# Patient Record
Sex: Female | Born: 1995 | Hispanic: No | Marital: Single | Smoking: Never smoker
Health system: Southern US, Community
[De-identification: ages and names within clinical notes are randomized; demographics above are authoritative.]

## PROBLEM LIST (undated history)

## (undated) DIAGNOSIS — J45909 Unspecified asthma, uncomplicated: Secondary | ICD-10-CM

## (undated) HISTORY — PX: APPENDECTOMY: SHX54

## (undated) HISTORY — DX: Unspecified asthma, uncomplicated: J45.909

---

## 2019-11-23 ENCOUNTER — Other Ambulatory Visit: Payer: Self-pay

## 2019-11-23 DIAGNOSIS — Z20822 Contact with and (suspected) exposure to covid-19: Secondary | ICD-10-CM

## 2019-11-25 LAB — NOVEL CORONAVIRUS, NAA: SARS-CoV-2, NAA: NOT DETECTED

## 2021-05-28 ENCOUNTER — Other Ambulatory Visit: Payer: Self-pay

## 2021-05-28 ENCOUNTER — Emergency Department (HOSPITAL_COMMUNITY): Payer: 59

## 2021-05-28 ENCOUNTER — Emergency Department (HOSPITAL_BASED_OUTPATIENT_CLINIC_OR_DEPARTMENT_OTHER)
Admission: EM | Admit: 2021-05-28 | Discharge: 2021-05-29 | Disposition: A | Discharge status: Home / Self Care | Payer: 59 | Attending: Emergency Medicine | Admitting: Emergency Medicine

## 2021-05-28 ENCOUNTER — Encounter (HOSPITAL_BASED_OUTPATIENT_CLINIC_OR_DEPARTMENT_OTHER): Payer: Self-pay

## 2021-05-28 DIAGNOSIS — R531 Weakness: Secondary | ICD-10-CM | POA: Insufficient documentation

## 2021-05-28 DIAGNOSIS — R29898 Other symptoms and signs involving the musculoskeletal system: Secondary | ICD-10-CM

## 2021-05-28 DIAGNOSIS — R202 Paresthesia of skin: Secondary | ICD-10-CM | POA: Insufficient documentation

## 2021-05-28 DIAGNOSIS — J45909 Unspecified asthma, uncomplicated: Secondary | ICD-10-CM | POA: Diagnosis not present

## 2021-05-28 DIAGNOSIS — R519 Headache, unspecified: Secondary | ICD-10-CM | POA: Diagnosis not present

## 2021-05-28 DIAGNOSIS — R200 Anesthesia of skin: Secondary | ICD-10-CM

## 2021-05-28 LAB — BASIC METABOLIC PANEL
Anion gap: 9 (ref 5–15)
BUN: 11 mg/dL (ref 6–20)
CO2: 24 mmol/L (ref 22–32)
Calcium: 9.6 mg/dL (ref 8.9–10.3)
Chloride: 104 mmol/L (ref 98–111)
Creatinine, Ser: 0.67 mg/dL (ref 0.44–1.00)
GFR, Estimated: >60 mL/min (ref >60)
Glucose, Bld: 137 mg/dL — ABNORMAL HIGH (ref 70–99)
Potassium: 3.8 mmol/L (ref 3.5–5.1)
Sodium: 137 mmol/L (ref 135–145)

## 2021-05-28 LAB — CBC WITH DIFFERENTIAL/PLATELET
Abs Immature Granulocytes: 0.02 10*3/uL (ref 0.00–0.07)
Basophils Absolute: 0 10*3/uL (ref 0.0–0.1)
Basophils Relative: 1 %
Eosinophils Absolute: 0.3 10*3/uL (ref 0.0–0.5)
Eosinophils Relative: 3 %
HCT: 40.6 % (ref 36.0–46.0)
Hemoglobin: 13.3 g/dL (ref 12.0–15.0)
Immature Granulocytes: 0 %
Lymphocytes Relative: 31 %
Lymphs Abs: 2.6 10*3/uL (ref 0.7–4.0)
MCH: 28.9 pg (ref 26.0–34.0)
MCHC: 32.8 g/dL (ref 30.0–36.0)
MCV: 88.1 fL (ref 80.0–100.0)
Monocytes Absolute: 0.5 10*3/uL (ref 0.1–1.0)
Monocytes Relative: 5 %
Neutro Abs: 5.1 10*3/uL (ref 1.7–7.7)
Neutrophils Relative %: 60 %
Platelets: 222 10*3/uL (ref 150–400)
RBC: 4.61 MIL/uL (ref 3.87–5.11)
RDW: 12.8 % (ref 11.5–15.5)
WBC: 8.5 10*3/uL (ref 4.0–10.5)
nRBC: 0 % (ref 0.0–0.2)

## 2021-05-28 LAB — MAGNESIUM: Magnesium: 1.9 mg/dL (ref 1.7–2.4)

## 2021-05-28 IMAGING — MR MR HEAD WO/W CM
13 of 16 series · 28 of 48 positions shown · IV contrast (gadavist)
Comparison: None.

CLINICAL DATA: Bilateral lower extremity weakness.

EXAM:
MRI HEAD WITHOUT AND WITH CONTRAST
TECHNIQUE: Multiplanar, multiecho pulse sequences of the brain and surrounding
structures were obtained without and with intravenous contrast.
CONTRAST:  6.5mL GADAVIST GADOBUTROL 1 MMOL/ML IV SOLN

[Series 5: T1 · sagittal · 5.0mm · 0.75mm/px · 1 of 31 slices shown]
[im 1/31]
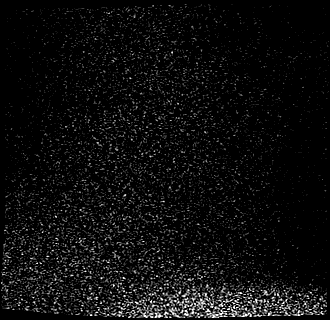

[Series 6: DWI · axial · 3.0mm · 0.88mm/px · z∈[-139,+6]mm · 4 of 102 slices shown (1 of 2)]
[im 1/102]
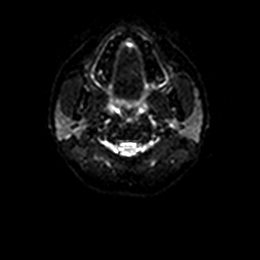
[im 34/102]
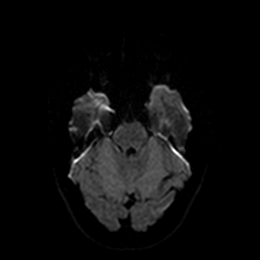
[im 68/102]
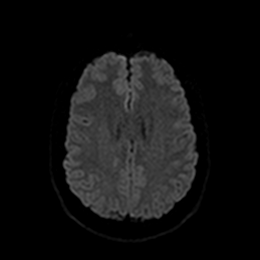
[im 102/102]
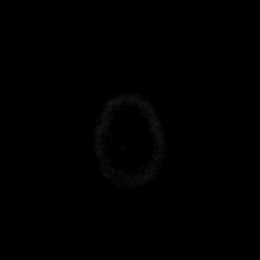

[Series 7: DWI · axial · 3.0mm · 0.88mm/px · z∈[-139,+6]mm · 2 of 51 slices shown (2 of 2)]
[im 1/51]
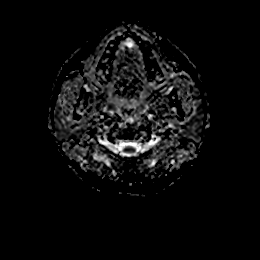
[im 51/51]
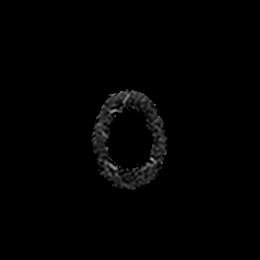

[Series 8: T2 · axial · 4.0mm · 0.72mm/px · 1 of 32 slices shown (1 of 2)]
[im 1/32]
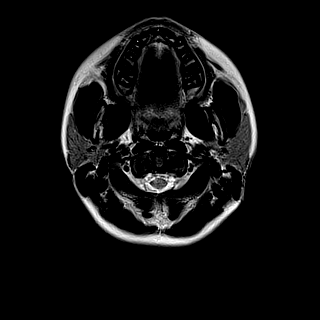

[Series 9: FLAIR · axial · 3.0mm · 0.45mm/px · z∈[-136,+8]mm · 2 of 44 slices shown]
[im 1/44]
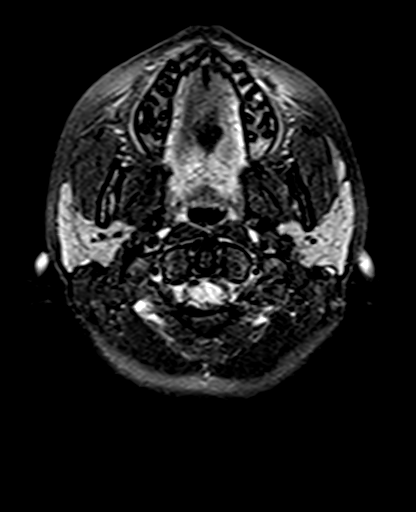
[im 44/44]
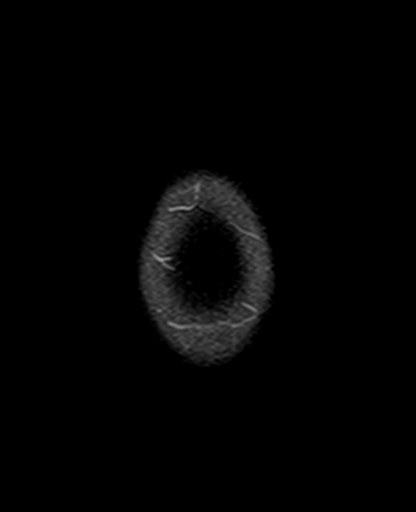

[Series 11: pha_images · axial · 3.0mm · 0.90mm/px · z∈[-143,+25]mm · 2 of 55 slices shown]
[im 1/55]
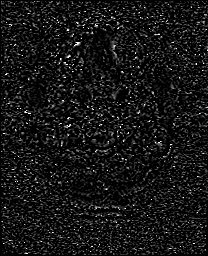
[im 55/55]
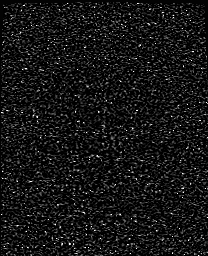

[Series 12: swi_images · axial · 3.0mm · 0.90mm/px · z∈[-143,+28]mm · 2 of 60 slices shown]
[im 1/60]
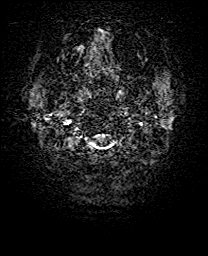
[im 60/60]
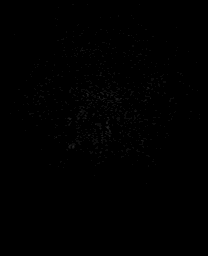

[Series 14: t2_space_dark-fluid_sag_p2_ns-ir · sagittal · 1.0mm · 0.49mm/px · 7 of 192 slices shown]
[im 1/192]
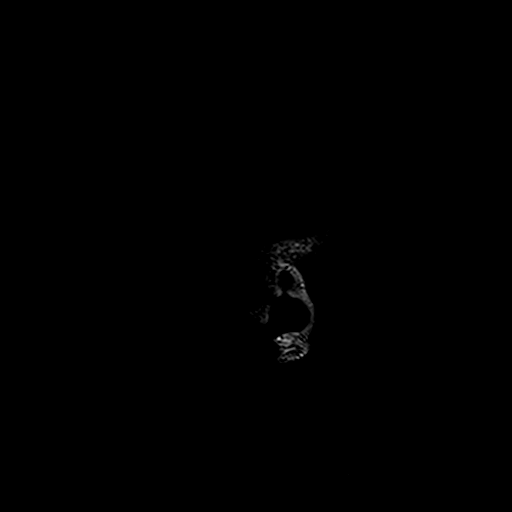
[im 32/192]
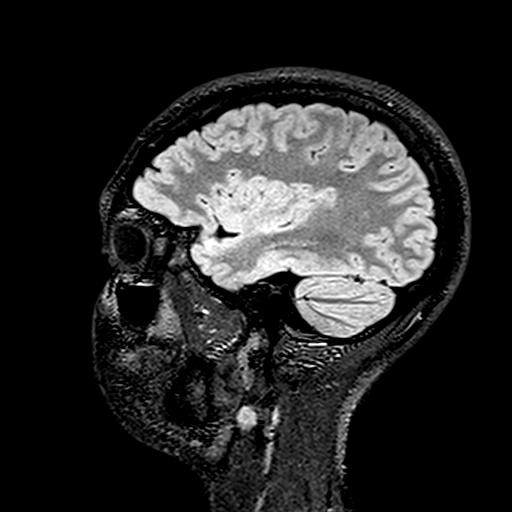
[im 64/192]
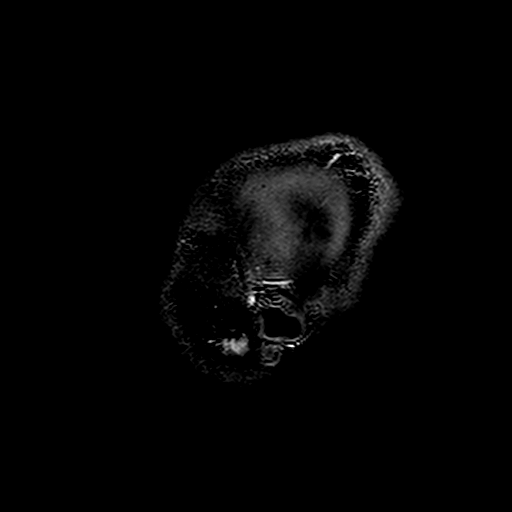
[im 96/192]
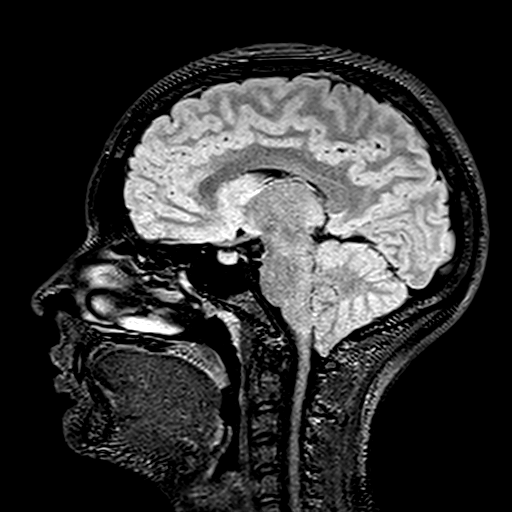
[im 128/192]
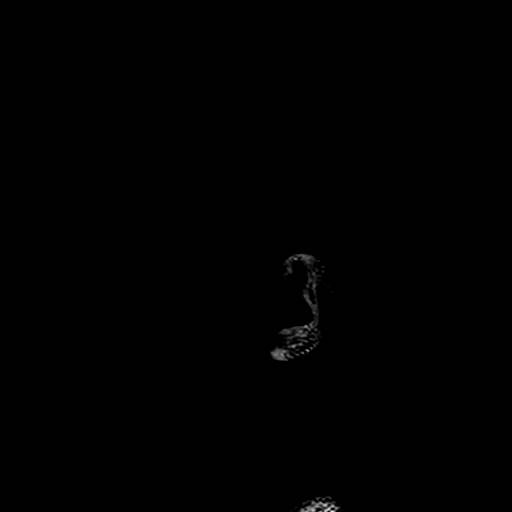
[im 160/192]
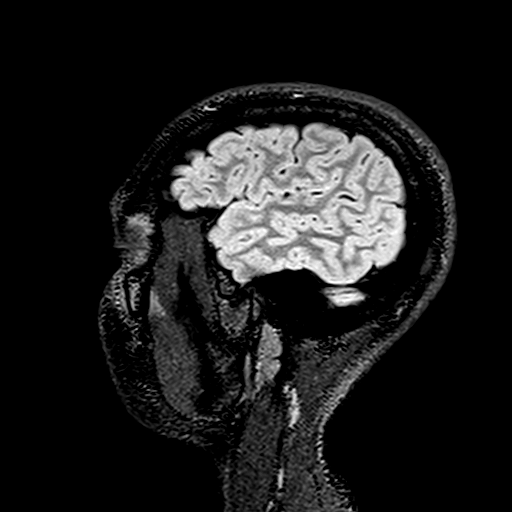
[im 192/192]
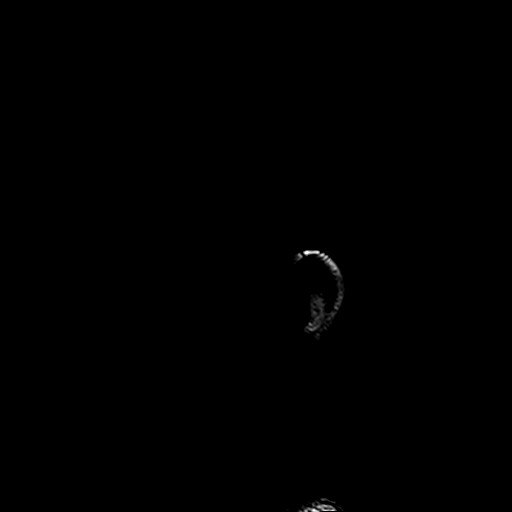

[Series 15: t2_space_dark-fluid_sag_p2_ns-ir_mpr_ axial · axial · 1.0mm · 0.45mm/px · z∈[-126,-58]mm · 3 of 142 slices shown]
[im 1/142]
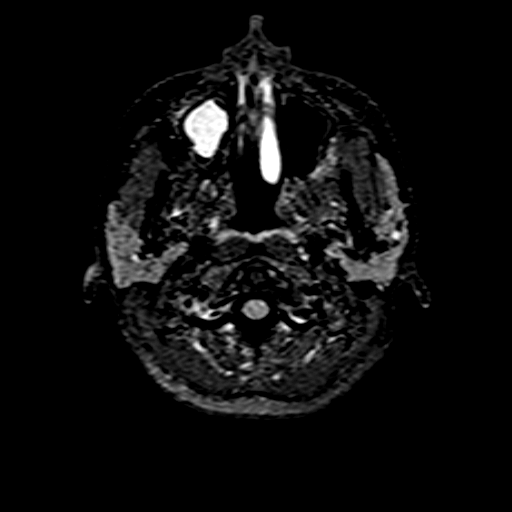
[im 36/142]
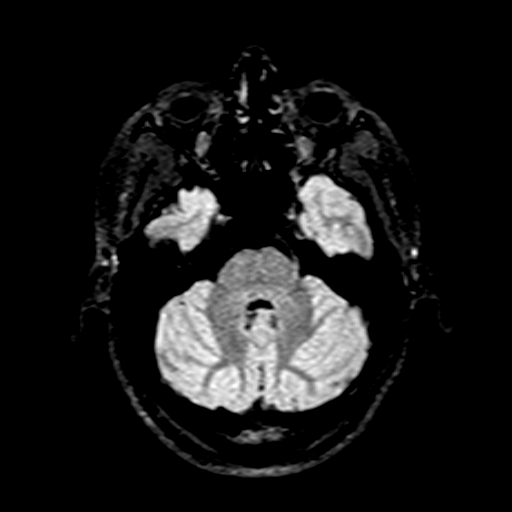
[im 71/142]
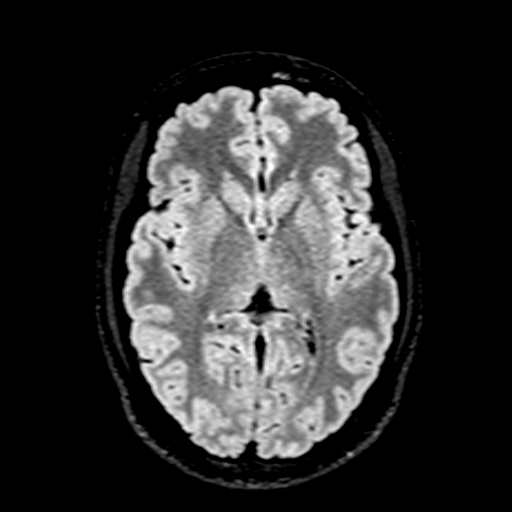

[Series 18: T2 · coronal · 5.0mm · 0.34mm/px · 1 of 29 slices shown (2 of 2)]
[im 1/29]
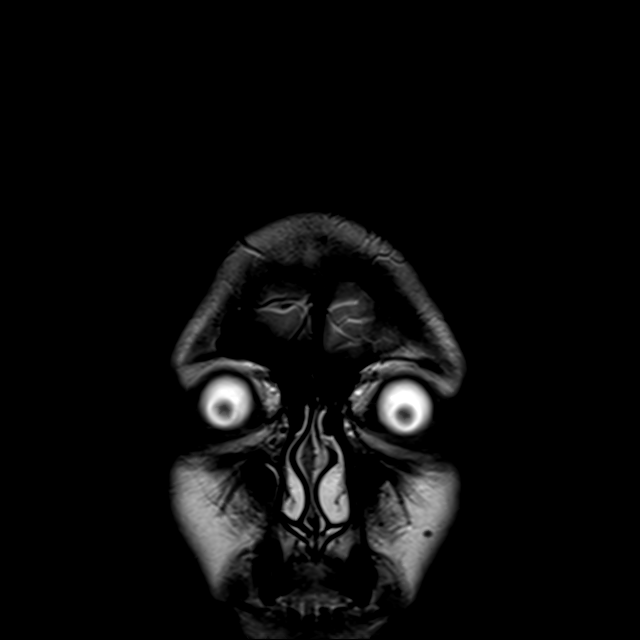

[Series 19: T2 post-contrast · coronal · 5.0mm · 0.72mm/px · 1 of 28 slices shown]
[im 1/28]
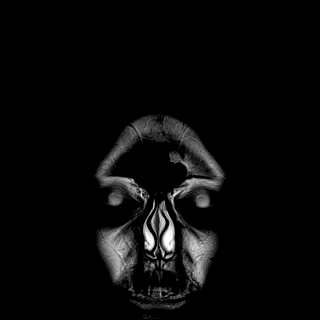

[Series 21: T1 post-contrast · coronal · 5.0mm · 0.34mm/px · 1 of 29 slices shown (1 of 2)]
[im 1/29]
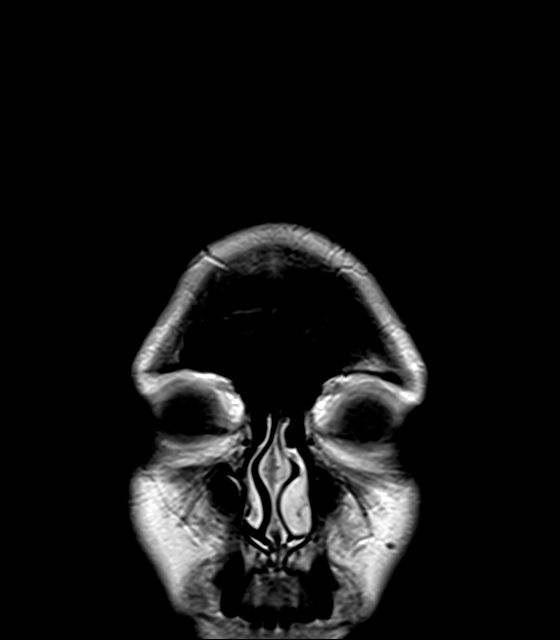

[Series 22: T1 post-contrast · sagittal · 4.0mm · 0.75mm/px · 1 of 31 slices shown (2 of 2)]
[im 1/31]
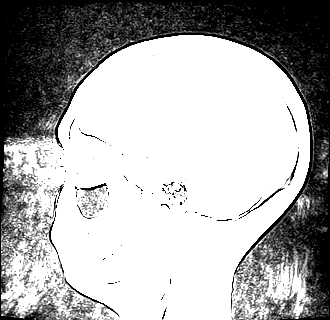

[28 of 48 positions shown; findings below may reference images not displayed]

FINDINGS: Brain: No acute infarct, acute hemorrhage or extra-axial collection.
Normal white matter signal. Normal volume of CSF spaces. No chronic
microhemorrhage. Normal midline structures. There is no abnormal
contrast enhancement.

Vascular: Major flow voids are preserved.

Skull and upper cervical spine: Normal calvarium and skull base.
Visualized upper cervical spine and soft tissues are normal.

Sinuses/Orbits:No paranasal sinus fluid levels or advanced mucosal
thickening. No mastoid or middle ear effusion. Normal orbits.
IMPRESSION: Normal brain MRI.

## 2021-05-28 IMAGING — MR MR CERVICAL SPINE WO/W CM
7 of 8 series · 31 of 48 positions shown · IV contrast (Gadavist)
Comparison: None.

CLINICAL DATA: Bilateral lower extremity weakness

EXAM:
MRI CERVICAL SPINE WITHOUT AND WITH CONTRAST
TECHNIQUE: Multiplanar and multiecho pulse sequences of the cervical spine, to
include the craniocervical junction and cervicothoracic junction,
were obtained without and with intravenous contrast.
CONTRAST:  6.5mL GADAVIST GADOBUTROL 1 MMOL/ML IV SOLN

[Series 5: T2 · sagittal · 3.0mm · 0.69mm/px · 3 of 15 slices shown (1 of 2)]
[im 1/15]
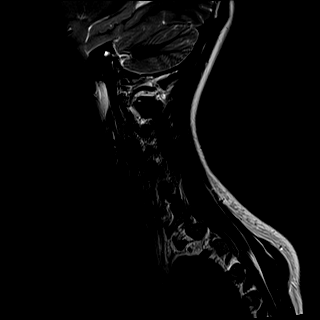
[im 8/15]
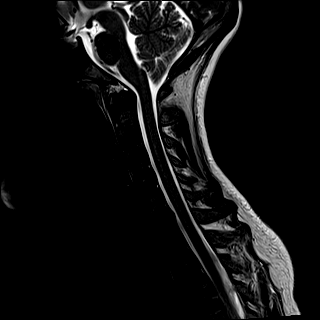
[im 15/15]
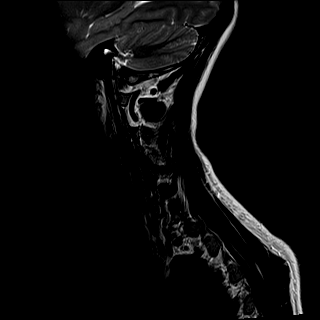

[Series 6: T1 · sagittal · 3.0mm · 0.69mm/px · 3 of 15 slices shown (1 of 2)]
[im 1/15]
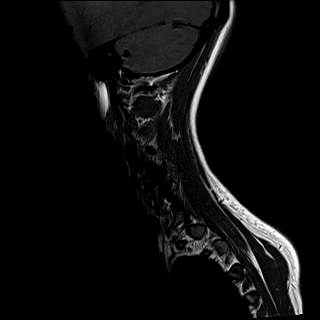
[im 8/15]
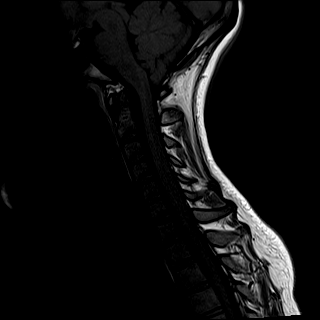
[im 15/15]
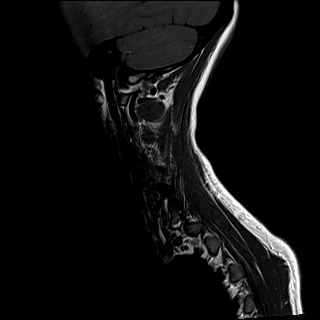

[Series 7: STIR · sagittal · 3.0mm · 0.86mm/px · 3 of 15 slices shown]
[im 1/15]
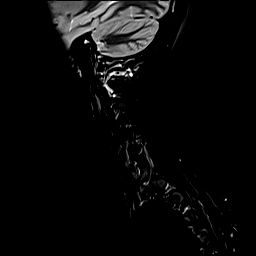
[im 8/15]
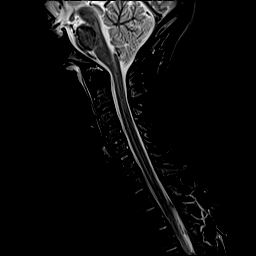
[im 15/15]
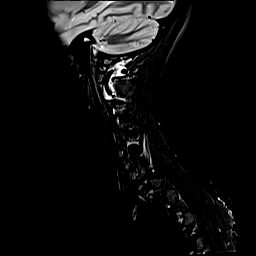

[Series 8: T2 · axial · 3.0mm · 0.66mm/px · z∈[-251,-131]mm · 9 of 40 slices shown (2 of 2)]
[im 1/40]
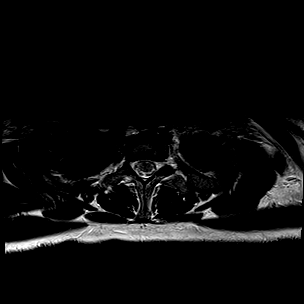
[im 5/40]
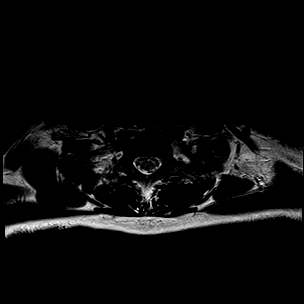
[im 10/40]
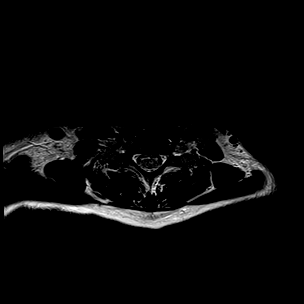
[im 15/40]
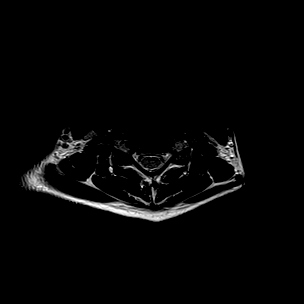
[im 20/40]
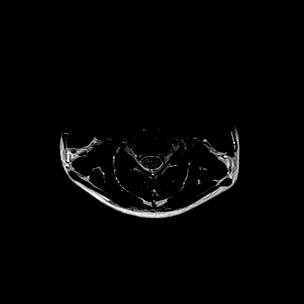
[im 25/40]
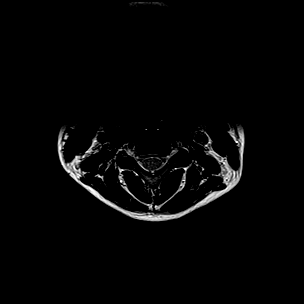
[im 30/40]
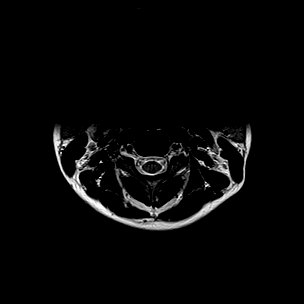
[im 35/40]
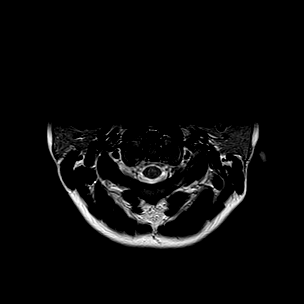
[im 40/40]
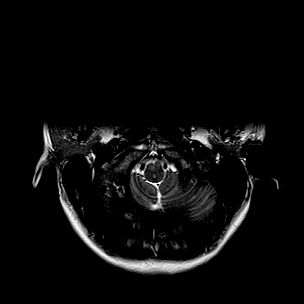

[Series 10: T1 · axial · 3.0mm · 0.39mm/px · z∈[-251,-131]mm · 9 of 40 slices shown (2 of 2)]
[im 1/40]
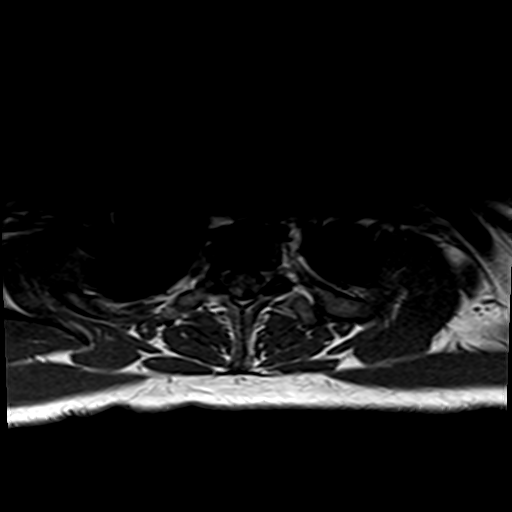
[im 5/40]
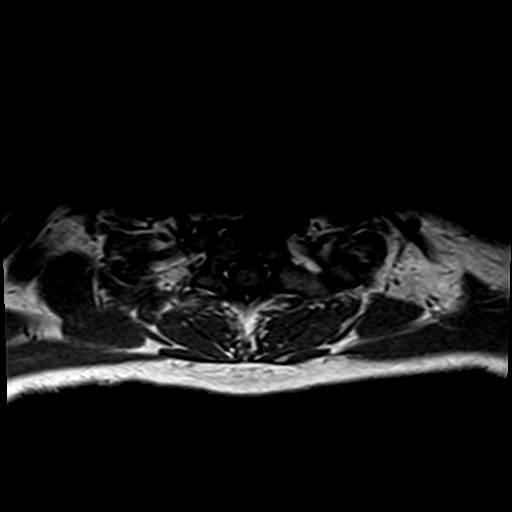
[im 10/40]
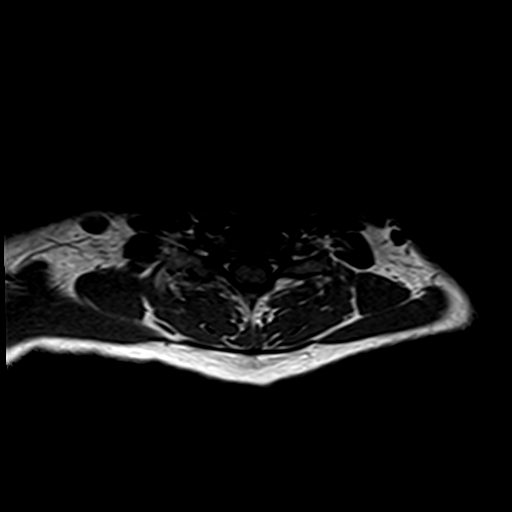
[im 15/40]
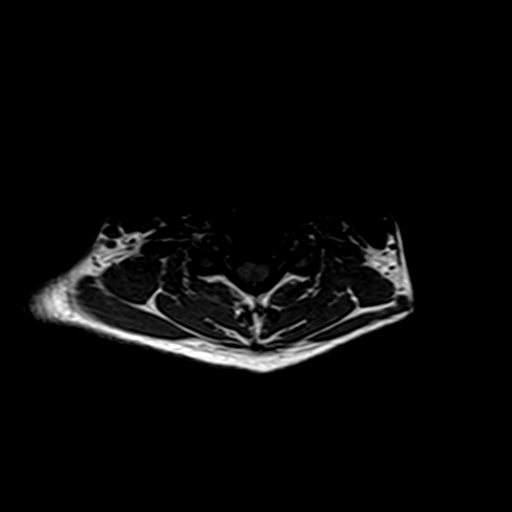
[im 20/40]
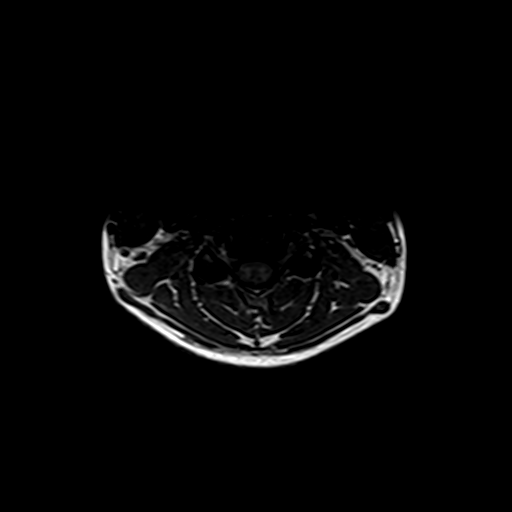
[im 25/40]
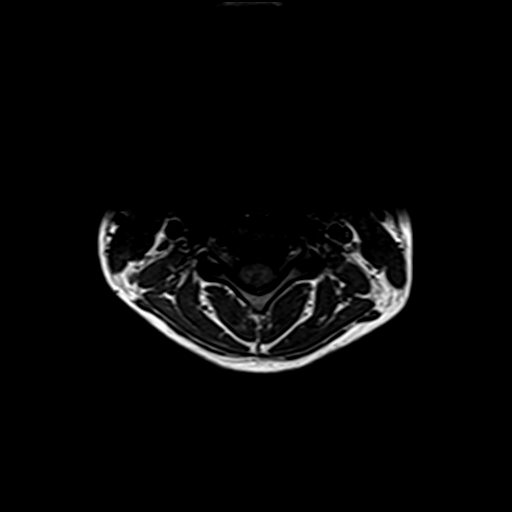
[im 30/40]
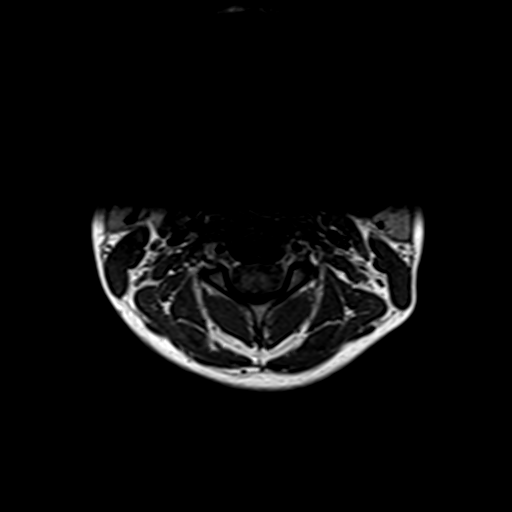
[im 35/40]
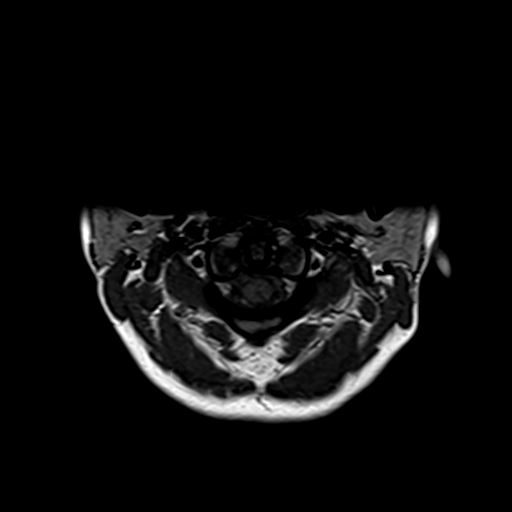
[im 40/40]
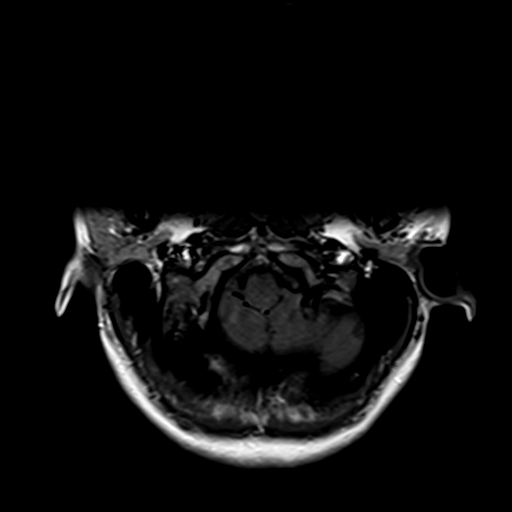

[Series 11: T1 fat-sat post-contrast · sagittal · 3.0mm · 0.43mm/px · 3 of 15 slices shown]
[im 1/15]
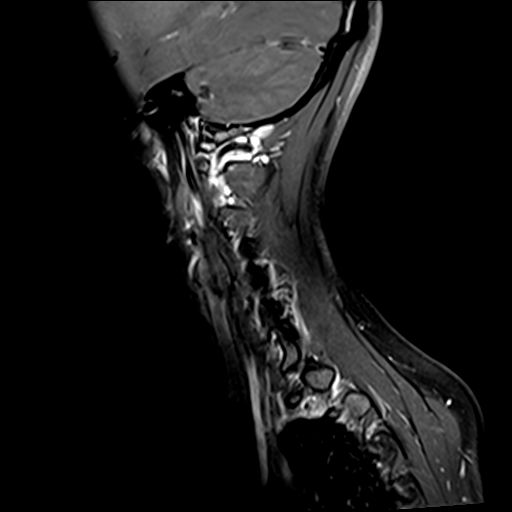
[im 8/15]
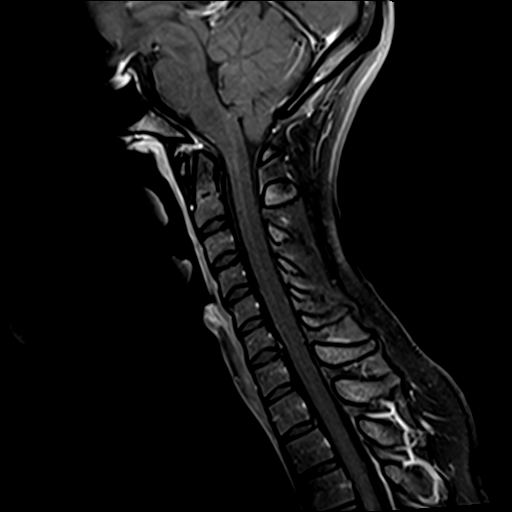
[im 15/15]
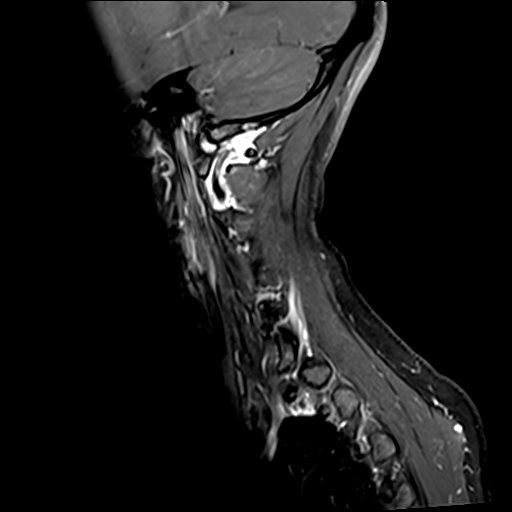

[Series 12: T1 post-contrast · axial · 3.0mm · 0.39mm/px · 1 of 40 slices shown]
[im 1/40]
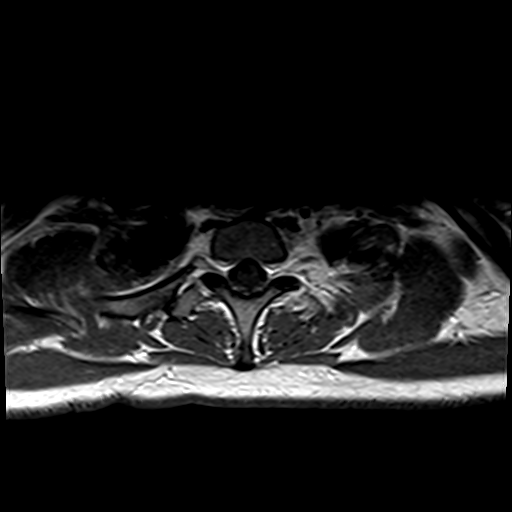

[31 of 48 positions shown; findings below may reference images not displayed]

FINDINGS: Alignment: Physiologic.

Vertebrae: No fracture, evidence of discitis, or bone lesion.

Cord: Normal signal and morphology.

Posterior Fossa, vertebral arteries, paraspinal tissues: Negative.

Disc levels:

C6-7: Minimal disc bulge without spinal canal or neural foraminal
stenosis. The other disc levels are normal.
IMPRESSION: Normal MRI of the cervical spine.

## 2021-05-28 MED ORDER — GADOBUTROL 1 MMOL/ML IV SOLN
6.5000 mL | Freq: Once | INTRAVENOUS | Status: AC | PRN
  Administered 2021-05-28: 6.5 mL via INTRAVENOUS

## 2021-05-28 NOTE — ED Provider Notes (Signed)
[facility] EMERGENCY DEPT Provider Note   CSN: [id number] Arrival date & time: 05/28/21  1754     History Chief Complaint  Patient presents with   Numbness    Miranda Wall is a 25 y.o. female presenting to the emergency department with constellation of symptoms.  The patient reports that for the past 24 hours, she has began having paresthesias and tingling in both of her fingers.  There is also involve the right side of her face.  She has a very mild headache and reports that her speech is been difficult, with confusion getting words out today.  She feels she has had some mild vertigo.  She is primarily concerned because she has had progressive weakness of both of her legs, the left more than the right.  She has a difficult time even driving or walking on her feet.  She says this all developed over the past 24 hours.  She reports the paresthesias in her left leg as well.  She denies blurred vision but reports that her right eye "just feels funny".  She denies pain in her eyes.  She said she had similar symptoms about a year ago with paresthesias and tingling on her body but it resolved after few hours and she never had it worked up further.  She has not had an MRI or any other imaging of her brain done.  She denies any other medical problems.  She denies any foreign travel.  She denies fevers or chills.  She denies consumption of meat or possible raw meat products.  She denies any diarrhea or GI symptoms.  She does report a family history of multiple sclerosis and one great aunt that she is related by blood.  No other significant family history of autoimmune disease.  HPI     Past Medical History:  Diagnosis Date   Asthma     There are no problems to display for this patient.   Past Surgical History:  Procedure Laterality Date   APPENDECTOMY       OB History   No obstetric history on file.     No family history on file.  Social History   Tobacco Use    Smoking status: Never   Smokeless tobacco: Never  Vaping Use   Vaping Use: Never used  Substance Use Topics   Alcohol use: Never   Drug use: Never    Home Medications Prior to Admission medications   Not on File    Allergies    Menthol  Review of Systems   Review of Systems  Constitutional:  Negative for chills and fever.  HENT:  Negative for ear pain and sore throat.   Eyes:  Negative for redness and visual disturbance.  Respiratory:  Negative for cough and shortness of breath.   Cardiovascular:  Negative for chest pain and palpitations.  Gastrointestinal:  Negative for abdominal pain and vomiting.  Genitourinary:  Negative for dysuria and hematuria.  Musculoskeletal:  Negative for arthralgias and back pain.  Skin:  Negative for color change and rash.  Neurological:  Positive for dizziness, speech difficulty, weakness, light-headedness, numbness and headaches. Negative for syncope.  All other systems reviewed and are negative.  Physical Exam Updated Vital Signs BP 113/63   Pulse 80   Temp 98.3 F (36.8 C) (Oral)   Resp 16   Ht 5\' 8"  (1.727 m)   Wt 64.9 kg   LMP 04/30/2021 (Approximate)   SpO2 100%   BMI 21.74 kg/m  Physical Exam Constitutional:      General: She is not in acute distress. HENT:     Head: Normocephalic and atraumatic.  Eyes:     General: No visual field deficit.    Extraocular Movements: Extraocular movements intact.     Conjunctiva/sclera: Conjunctivae normal.     Pupils: Pupils are equal, round, and reactive to light.     Comments: NO afferent pupil defect  Cardiovascular:     Rate and Rhythm: Normal rate and regular rhythm.  Pulmonary:     Effort: Pulmonary effort is normal. No respiratory distress.  Abdominal:     General: There is no distension.     Tenderness: There is no abdominal tenderness.  Skin:    General: Skin is warm and dry.  Neurological:     General: No focal deficit present.     Mental Status: She is alert.  Mental status is at baseline.     GCS: GCS eye subscore is 4. GCS verbal subscore is 5. GCS motor subscore is 6.     Cranial Nerves: No dysarthria.     Comments: Weakness in bilateral lower extremities Right sided facial paresthesias Cranial nerves otherwise intact Normal upper body strength  Psychiatric:        Mood and Affect: Mood normal.        Behavior: Behavior normal.    ED Results / Procedures / Treatments   Labs (all labs ordered are listed, but only abnormal results are displayed) Labs Reviewed  BASIC METABOLIC PANEL - Abnormal; Notable for the following components:      Result Value   Glucose, Bld 137 (*)    All other components within normal limits  CBC WITH DIFFERENTIAL/PLATELET  MAGNESIUM    EKG None  Radiology MR Brain W and Wo Contrast  Result Date: 05/28/2021 CLINICAL DATA:  Bilateral lower extremity weakness. EXAM: MRI HEAD WITHOUT AND WITH CONTRAST TECHNIQUE: Multiplanar, multiecho pulse sequences of the brain and surrounding structures were obtained without and with intravenous contrast. CONTRAST:  6.72mL GADAVIST GADOBUTROL 1 MMOL/ML IV SOLN COMPARISON:  None. FINDINGS: Brain: No acute infarct, acute hemorrhage or extra-axial collection. Normal white matter signal. Normal volume of CSF spaces. No chronic microhemorrhage. Normal midline structures. There is no abnormal contrast enhancement. Vascular: Major flow voids are preserved. Skull and upper cervical spine: Normal calvarium and skull base. Visualized upper cervical spine and soft tissues are normal. Sinuses/Orbits:No paranasal sinus fluid levels or advanced mucosal thickening. No mastoid or middle ear effusion. Normal orbits. IMPRESSION: Normal brain MRI. Electronically Signed   By: Deatra Robinson M.D.   On: 05/28/2021 22:03   MR Cervical Spine W or Wo Contrast  Result Date: 05/28/2021 CLINICAL DATA:  Bilateral lower extremity weakness EXAM: MRI CERVICAL SPINE WITHOUT AND WITH CONTRAST TECHNIQUE: Multiplanar  and multiecho pulse sequences of the cervical spine, to include the craniocervical junction and cervicothoracic junction, were obtained without and with intravenous contrast. CONTRAST:  6.55mL GADAVIST GADOBUTROL 1 MMOL/ML IV SOLN COMPARISON:  None. FINDINGS: Alignment: Physiologic. Vertebrae: No fracture, evidence of discitis, or bone lesion. Cord: Normal signal and morphology. Posterior Fossa, vertebral arteries, paraspinal tissues: Negative. Disc levels: C6-7: Minimal disc bulge without spinal canal or neural foraminal stenosis. The other disc levels are normal. IMPRESSION: Normal MRI of the cervical spine. Electronically Signed   By: Deatra Robinson M.D.   On: 05/28/2021 22:05    Procedures Procedures   Medications Ordered in ED Medications  gadobutrol (GADAVIST) 1 MMOL/ML injection 6.5 mL (6.5 mLs  Intravenous Contrast Given 05/28/21 2146)    ED Course  I have reviewed the triage vital signs and the nursing notes.  Pertinent labs & imaging results that were available during my care of the patient were reviewed by me and considered in my medical decision making (see chart for details).  Ddx includes new onset MS   With family hx this presentation is most concerning for MS, particularly with her family hx and her prior "episode" a few months ago.   I discussed with neurology and we'll transfer her to [facility] for MRI imaging of the brain and C-spine.    This presentation is not consistent with a stroke, and she does not have stroke risk factors.  Likewise there is no history consistent with GBS - no recent GI symptoms or viral syndrome.  No clinical indications of epidural or spinal abscess, traumatic injury to the spine, brain bleed, or meningitis.  Labs reviewed -BMP and CBC unremarkable.  Electrolytes wnl.  Vitals normal here.  Clinical Course as of 05/29/21 1308  Wed May 28, 2021  6834 I spoke to Dr Otelia Limes and arora from neurology who agreed with transfer to [facility] ED for MRI brain  and C spine with and without contrast for MS evaluation.  Their team can be consulted subsequently.  Pt updated and I will have her husband drive her to [facility] ED [[place]]  1925 Dr Linwood Dibbles accepting EDP at [facility] [[place]]    Clinical Course User Index [[place]] Renaye Rakers Kermit Balo, MD   Final Clinical Impression(s) / ED Diagnoses Final diagnoses:  Weakness of both legs  Paresthesia  Numbness    Rx / DC Orders ED Discharge Orders     None        Terald Sleeper, MD 05/29/21 1308

## 2021-05-28 NOTE — Discharge Instructions (Addendum)
Your MRIs today did not show any concerning findings including no stroke, tumors, bleeding, or MS.  I spoke with neurology who feels you are safe for discharge home but does not need to follow-up with outpatient neurology to discuss further outpatient management.  If any symptoms change or worsen, please return to the nearest emergency department.

## 2021-05-28 NOTE — ED Notes (Signed)
ED Provider at bedside. 

## 2021-05-28 NOTE — ED Triage Notes (Signed)
Pt reports numbness in bilateral legs, arms and back.  Has weakness in both legs starting today, states she has been experiencing "vertigo off and on for last 24 hours.  Reports slurred speech today.  Pt walked to triage room 1, steady gait, NAD.

## 2021-05-28 NOTE — ED Provider Notes (Signed)
Patient transferred from [facility] for MRI.  Patient was sent for MRI with and without contrast of her head and neck rule out MS, stroke, tumors, or other acute abnormality causing her symptoms.  MRIs returned and were completely negative.  I spoke with neurology who agrees she is appropriate for outpatient neurology follow-up and discharge.  Patient agrees with plan and patient discharged in good condition after reassuring work-up here.   Clinical Impression: 1. Weakness of both legs   2. Paresthesia   3. Numbness     Disposition: Discharge  Condition: Good  I have discussed the results, Dx and Tx plan with the pt(& family if present). He/she/they expressed understanding and agree(s) with the plan. Discharge instructions discussed at great length. Strict return precautions discussed and pt &/or family have verbalized understanding of the instructions. No further questions at time of discharge.    New Prescriptions   No medications on file    Follow Up: [facility] [address] [place] [address] [phone]    Alberteen Sam, FNP [address] [place] [address] [phone]        Mylen Mangan, Canary Brim, MD 05/28/21 [phone]

## 2021-05-28 NOTE — ED Notes (Signed)
  Patient being transferred POV to [facility] for MRI.  Dr Renaye Rakers spoke with Dr Lynelle Doctor and he is accepting the patient.  MRI is awaiting patient arrival.  PIV secured and report given to [employer] charge at [facility].

## 2021-05-28 NOTE — ED Notes (Signed)
Pt arrived POV for MRI, MRI contacted to notify of pt arrival
